# Patient Record
Sex: Female | Born: 1950 | Race: White | Hispanic: No | Marital: Married
Health system: Midwestern US, Academic
[De-identification: ages and names within clinical notes are randomized; demographics above are authoritative.]

---

## 2017-01-17 ENCOUNTER — Encounter: Admit: 2017-01-17 | Discharge: 2017-01-17 | Payer: MEDICARE

## 2017-01-17 DIAGNOSIS — C50919 Malignant neoplasm of unspecified site of unspecified female breast: Principal | ICD-10-CM

## 2017-01-17 NOTE — Telephone Encounter
Spoke with patient regarding results of MRI: meningioma is stable (no change), no signs of masses. Results faxed to patients otologists office as this is the doctor that ordered the scan. Fax number [phone]

## 2017-01-17 NOTE — Progress Notes
MRI brain from January 14, 2017 [facility] shows a meningioma of the left frontal lobe stable.  Negative for mass in the cerebellopontine angle or internal auditory canal.  They have me down with the ordering physician but I did not order this study.

## 2017-07-11 ENCOUNTER — Encounter: Admit: 2017-07-11 | Discharge: 2017-07-11 | Payer: MEDICARE

## 2017-07-11 DIAGNOSIS — C50912 Malignant neoplasm of unspecified site of left female breast: Principal | ICD-10-CM

## 2017-07-11 DIAGNOSIS — C773 Secondary and unspecified malignant neoplasm of axilla and upper limb lymph nodes: ICD-10-CM

## 2017-07-11 DIAGNOSIS — N809 Endometriosis, unspecified: Principal | ICD-10-CM

## 2017-07-11 DIAGNOSIS — M810 Age-related osteoporosis without current pathological fracture: ICD-10-CM

## 2017-07-11 DIAGNOSIS — Z923 Personal history of irradiation: ICD-10-CM

## 2017-07-11 DIAGNOSIS — Z171 Estrogen receptor negative status [ER-]: ICD-10-CM

## 2017-07-11 DIAGNOSIS — E039 Hypothyroidism, unspecified: ICD-10-CM

## 2017-07-11 DIAGNOSIS — Z79810 Long term (current) use of selective estrogen receptor modulators (SERMs): ICD-10-CM

## 2017-07-11 DIAGNOSIS — C50919 Malignant neoplasm of unspecified site of unspecified female breast: ICD-10-CM

## 2018-01-25 LAB — COMPREHENSIVE METABOLIC PANEL
Lab: 0.6
Lab: 1.1 — ABNORMAL LOW (ref 3.7–6.7)
Lab: 10
Lab: 107
Lab: 146
Lab: 22
Lab: 29
Lab: 7.5
Lab: 87

## 2018-01-25 LAB — LIPID PROFILE
Lab: 211 — ABNORMAL HIGH (ref <200)
Lab: 86 — ABNORMAL HIGH (ref 40–60)

## 2018-01-27 ENCOUNTER — Encounter: Admit: 2018-01-27 | Discharge: 2018-01-27 | Payer: MEDICARE

## 2018-07-10 ENCOUNTER — Encounter: Admit: 2018-07-10 | Discharge: 2018-07-10 | Payer: MEDICARE

## 2018-07-10 DIAGNOSIS — Z1231 Encounter for screening mammogram for malignant neoplasm of breast: ICD-10-CM

## 2018-07-10 DIAGNOSIS — C50912 Malignant neoplasm of unspecified site of left female breast: Principal | ICD-10-CM

## 2018-09-11 ENCOUNTER — Encounter: Admit: 2018-09-11 | Discharge: 2018-09-11

## 2018-09-11 DIAGNOSIS — Z1231 Encounter for screening mammogram for malignant neoplasm of breast: Secondary | ICD-10-CM

## 2018-09-11 DIAGNOSIS — C50919 Malignant neoplasm of unspecified site of unspecified female breast: Secondary | ICD-10-CM

## 2018-09-11 DIAGNOSIS — N809 Endometriosis, unspecified: Secondary | ICD-10-CM

## 2018-09-11 DIAGNOSIS — Z08 Encounter for follow-up examination after completed treatment for malignant neoplasm: Secondary | ICD-10-CM

## 2018-09-11 DIAGNOSIS — Z86000 Personal history of in-situ neoplasm of breast: Secondary | ICD-10-CM

## 2018-09-11 DIAGNOSIS — C50912 Malignant neoplasm of unspecified site of left female breast: Secondary | ICD-10-CM

## 2018-09-11 DIAGNOSIS — E039 Hypothyroidism, unspecified: Secondary | ICD-10-CM

## 2018-09-11 DIAGNOSIS — M810 Age-related osteoporosis without current pathological fracture: Secondary | ICD-10-CM

## 2018-09-11 DIAGNOSIS — Z171 Estrogen receptor negative status [ER-]: Secondary | ICD-10-CM

## 2018-09-11 NOTE — Progress Notes
Name: Toni Cooke          MRN: [id number]      DOB: 06-Apr-1950      AGE: 68 y.o.   DATE OF SERVICE: 09/11/2018    Subjective:             Reason for Visit: Annual breast cancer follow-up visit  Heme/Onc Care      Toni Cooke is a 68 y.o. female.     Cancer Staging  Breast cancer [facility])  Staging form: Breast, AJCC 7th Edition  - Pathologic: Stage IIA (T1c, N1a, cM0) - Signed by Jorene Guest, MD on 02/06/2014      History of Present Illness  Toni Cooke is seen today in annual follow-up of her past breast cancer.  She remains in remission.  She underwent right screening 3D mammogram at [facility] 07/10/2018 with benign findings.  She underwent cardiac ablation in April 2019 and has not been in atrial fib since.  She is no longer on the Eliquis.    She and her husband continue to live in their RV and are planning to head to [place] next.  They keep an address in [place] but do not live there.       Review of Systems  Review of Systems   Constitutional: Negative for fever and chills.   HENT: Negative for ear pain, sore throat, mouth sores, trouble swallowing, neck pain and sinus pressure.    Eyes: Negative for visual disturbance.   Respiratory: Negative for shortness of breath.    Cardiovascular: Negative for chest pain.   Gastrointestinal: Negative for nausea, diarrhea, constipation and blood in stool.   Genitourinary: Negative for dysuria, urgency, frequency, difficulty urinating and dyspareunia.   Musculoskeletal: Negative.    Neurological: Negative for weakness, numbness and headaches.   Hematological: Does not bruise/bleed easily.   Psychiatric/Behavioral: Negative for confusion and decreased concentration. The patient is not nervous/anxious.        Objective:         ??? aspirin EC 81 mg tablet Take 81 mg by mouth daily. Take with food.   ??? CALCIUM CARBONATE/VITAMIN D3 (CALCIUM + D PO) Take 1 Tab by mouth daily.   ??? COLCRYS 0.6 mg tablet Take 1 tablet by mouth twice daily. ??? DENOSUMAB (PROLIA SC) Inject  into area(s) as directed. every 6 months   ??? flecainide (TAMBOCOR) 50 mg tablet Take 50 mg by mouth twice daily. Take 1 1/2 po bid = 75mg  BID   ??? ibuprofen (ADVIL) 200 mg tablet Take 200 mg by mouth every 6 hours as needed for Pain. Take with food.   ??? levothyroxine (SYNTHROID) 75 mcg tablet Take 75 mcg by mouth daily.   ??? metoprolol XL (TOPROL XL) 25 mg extended release tablet Take 1 tablet by mouth daily.   ??? pantoprazole DR (PROTONIX) 40 mg tablet Take 1 tablet by mouth twice daily.   ??? sucralfate (CARAFATE) 1 gram tablet Take 1 tablet by mouth twice daily.     Vitals:    09/11/18 0803 09/11/18 0804   BP: 110/60    Pulse: 58    Resp: 18    Temp: 36.3 ???C (97.3 ???F)    TempSrc: Temporal Temporal   SpO2: 98%    Weight: 60 kg (132 lb 3.2 oz)    Height: 157.5 cm (62)    PainSc: Zero      Body mass index is 24.18 kg/m???.     Pain Score: Zero  Fatigue Scale: 0-None    Pain Addressed:  N/A    Patient Evaluated for a Clinical Trial: Patient not eligible for a treatment trial (including not needing treatment, needs palliative care, in remission).     [name] is 0, Fully active, able to carry on all pre-disease performance without restriction.Marland Kitchen     Physical Exam     Very pleasant petite Caucasian female in no distress.  HEENT: No abnormalities.  Breast: Right breast without mass or adenopathy.  Left breast surgically absent with implant reconstruction.  Extremities without lymphedema.  Neuro: Alert and oriented x3.     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  Status post [facility] chemo which completed December 2012.  She is doing well with no evidence recurrence and continues with annual follow-up.

## 2019-04-18 IMAGING — MR MRI KNEE RT WO CONTRAST
5 of 6 series · 33 of 40 positions shown · non-contrast
Comparison: None.

INDICATION: Right knee pain
TECHNIQUE: Multiplanar, multiecho imaging of the right knee was performed, including T1-weighted and fluid sensitive sequences without intravenous contrast administration.

[Series 2: t2_axial_fs · axial · 4.0mm · 0.41mm/px · z∈[-26,+89]mm · 7 of 24 slices shown]
[im 1/24]
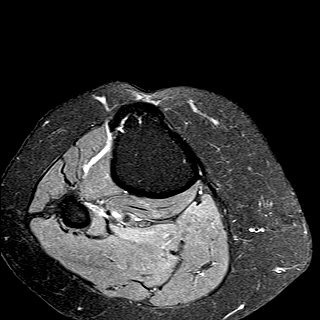
[im 4/24]
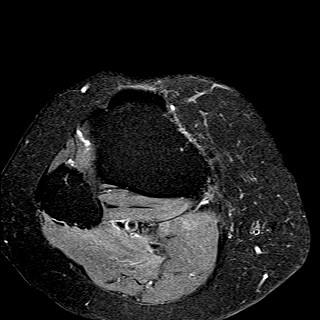
[im 8/24]
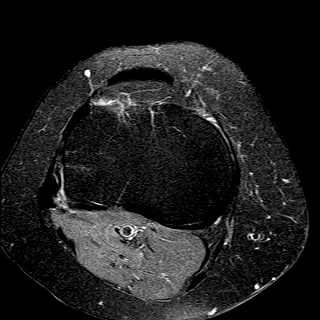
[im 12/24]
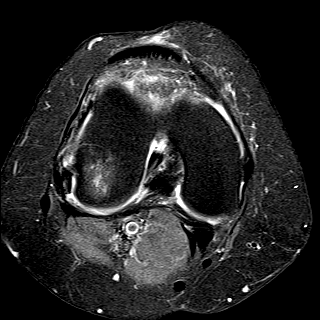
[im 16/24]
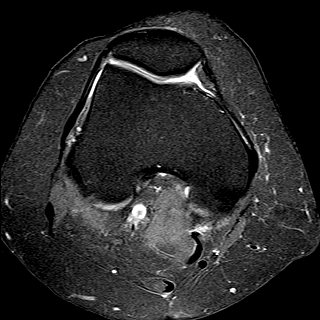
[im 20/24]
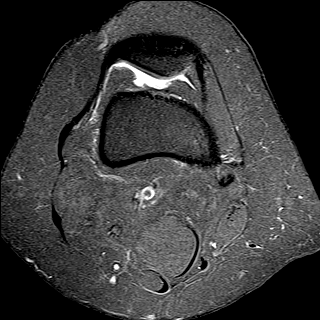
[im 24/24]
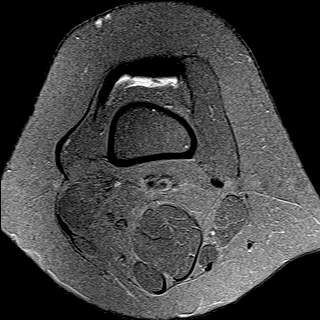

[Series 4: pd_sag_fs · sagittal · 3.0mm · 0.53mm/px · 7 of 25 slices shown]
[im 1/25]
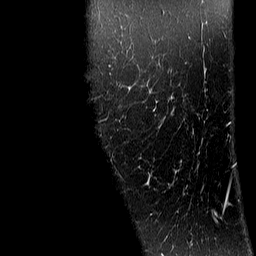
[im 5/25]
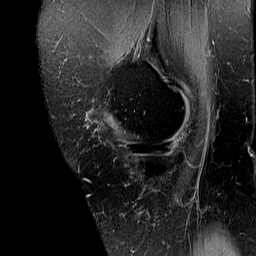
[im 9/25]
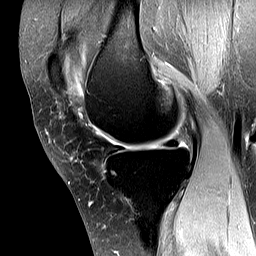
[im 13/25]
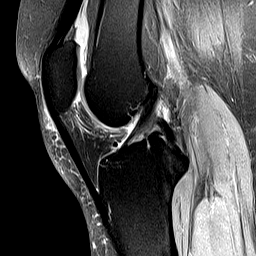
[im 17/25]
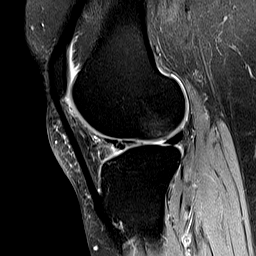
[im 21/25]
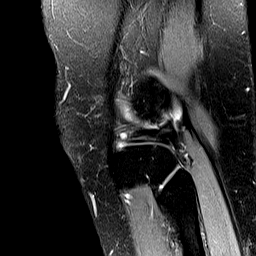
[im 25/25]
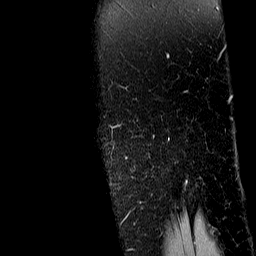

[Series 5: t2_sag_fs · sagittal · 3.0mm · 0.53mm/px · 7 of 25 slices shown]
[im 1/25]
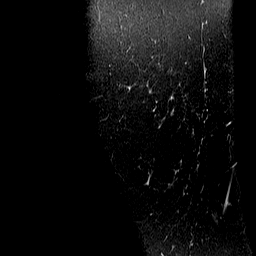
[im 5/25]
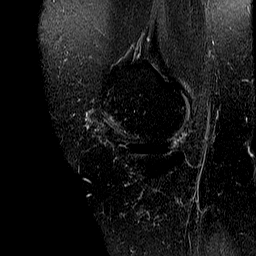
[im 9/25]
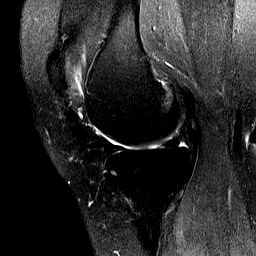
[im 13/25]
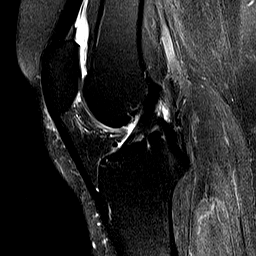
[im 17/25]
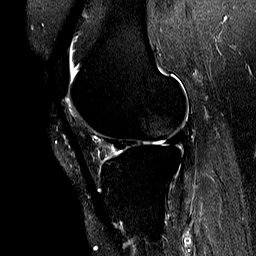
[im 21/25]
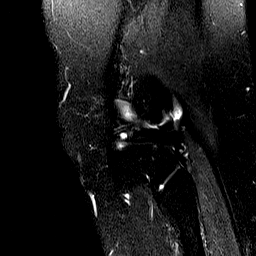
[im 25/25]
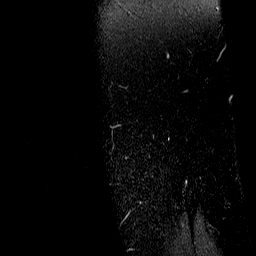

[Series 6: t1_cor · coronal · 4.0mm · 0.35mm/px · 6 of 20 slices shown]
[im 1/20]
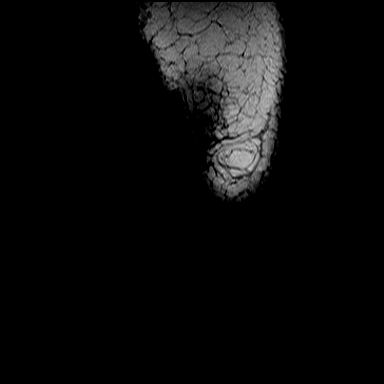
[im 4/20]
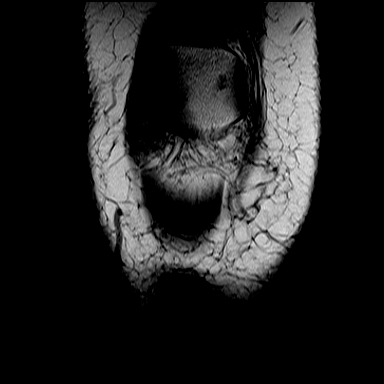
[im 8/20]
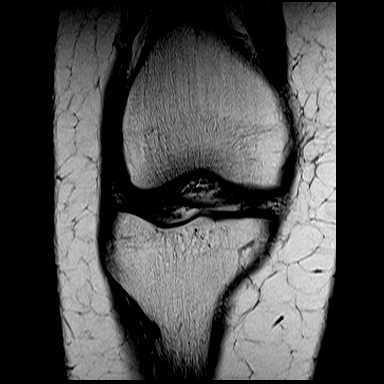
[im 12/20]
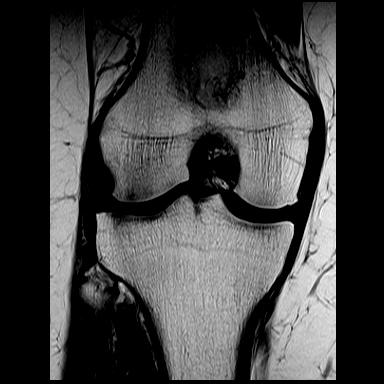
[im 16/20]
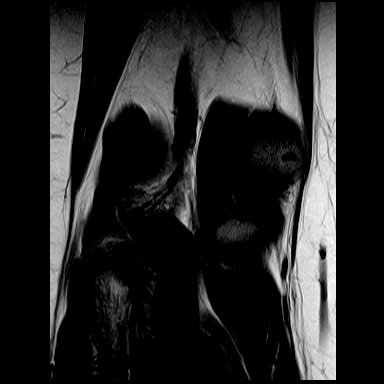
[im 20/20]
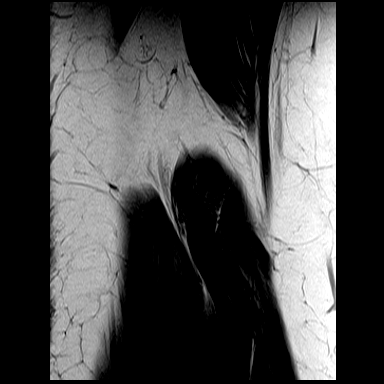

[Series 7: t2_cor_fs · coronal · 4.0mm · 0.41mm/px · 6 of 20 slices shown]
[im 1/20]
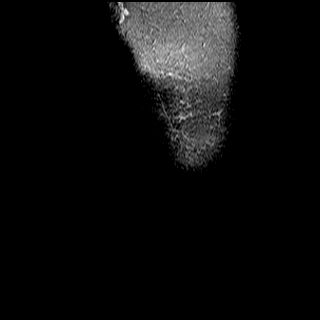
[im 4/20]
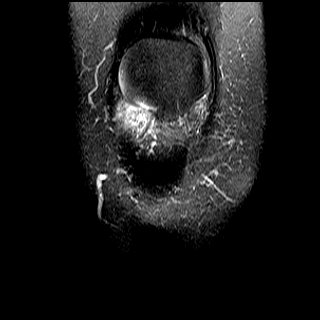
[im 8/20]
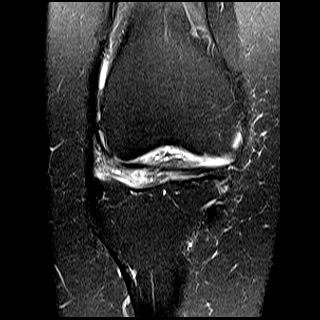
[im 12/20]
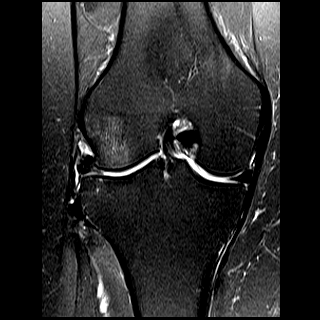
[im 16/20]
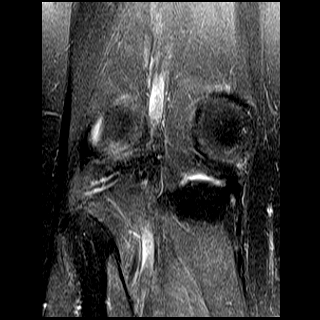
[im 20/20]
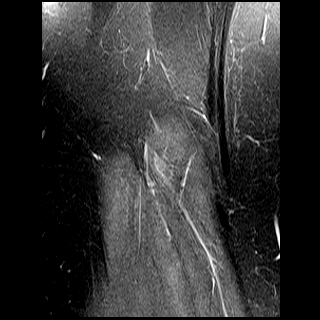

[33 of 40 positions shown; findings below may reference images not displayed]

FINDINGS: MEDIAL MENISCUS:  Mild intrasubstance degeneration at the body. Small tear near the free edge of the posterior horn/root with displaced flap toward the notch.

LATERAL MENISCUS:  Complex tear of the anterior horn and body with vertical undersurface and radial free edge components. Fraying of the free edge of the posterior horn.

ACL:  Intact.

PCL:  Intact.

MCL:  Intact.

LATERAL LIGAMENTS AND TENDONS:  Intact.

EXTENSOR MECHANISM:  The quadriceps and patellar tendons are intact.

FAT PADS:   Edema in the superior lateral aspect of Hoffa's fat, compatible with impingement.

CARTILAGE:  

Patellofemoral compartment:  Small high-grade partial thickness chondral defect at the medial patellar facet.

Medial compartment:  Chondral surface irregularity at the posterior weightbearing femoral condyle.

Lateral compartment: Extensive high-grade partial thickness chondral loss, particularly at the posterior tibial plateau and posterior weightbearing femoral condyle with subchondral reactive changes.

BONE MARROW: Subchondral reactive changes at the posterior lateral compartment. No acute fracture.

Small joint effusion. No Baker's cyst.
IMPRESSION: 1. Small tear near the free edge of the posterior horn/root medial meniscus, with displaced flap toward the notch.

2. Complex tear of the anterior horn and body lateral meniscus. Fraying at the free edge of the posterior horn lateral meniscus.

3. Moderate lateral compartment chondral abnormalities.

4. Mild patellofemoral and medial compartment chondral abnormalities.

## 2019-07-23 ENCOUNTER — Encounter: Admit: 2019-07-23 | Discharge: 2019-07-23 | Payer: MEDICARE

## 2019-07-23 DIAGNOSIS — Z1231 Encounter for screening mammogram for malignant neoplasm of breast: Secondary | ICD-10-CM

## 2019-07-23 DIAGNOSIS — C50912 Malignant neoplasm of unspecified site of left female breast: Secondary | ICD-10-CM

## 2020-07-16 ENCOUNTER — Encounter: Admit: 2020-07-16 | Discharge: 2020-07-16 | Payer: MEDICARE

## 2020-07-16 DIAGNOSIS — C50919 Malignant neoplasm of unspecified site of unspecified female breast: Secondary | ICD-10-CM

## 2020-07-16 DIAGNOSIS — Z1231 Encounter for screening mammogram for malignant neoplasm of breast: Secondary | ICD-10-CM

## 2020-07-16 DIAGNOSIS — N809 Endometriosis, unspecified: Secondary | ICD-10-CM

## 2020-07-16 DIAGNOSIS — E039 Hypothyroidism, unspecified: Secondary | ICD-10-CM

## 2020-07-16 DIAGNOSIS — M810 Age-related osteoporosis without current pathological fracture: Secondary | ICD-10-CM

## 2020-07-16 DIAGNOSIS — C50912 Malignant neoplasm of unspecified site of left female breast: Secondary | ICD-10-CM

## 2020-07-16 NOTE — Progress Notes
Name: Toni Cooke          MRN: [id number]      DOB: 27-Oct-1950      AGE: 70 y.o.   DATE OF SERVICE: 07/16/2020    Subjective:             Reason for Visit: Annual breast cancer follow-up visit  Heme/Onc Care      Toni Cooke is a 70 y.o. female.     Cancer Staging  Breast cancer [facility])  Staging form: Breast, AJCC 7th Edition  - Pathologic: Stage IIA (T1c, N1a, cM0) - Signed by Toni Guest, MD on 02/06/2014      History of Present Illness  Toni Cooke is seen today in annual follow-up of her past breast cancer.  She remains in remission.  She is scheduled for right screening 3D mammogram at [facility] on 07/21/2020.    She underwent cardiac ablation in April 2019 and has not been in atrial fib since.  She is no longer on the Eliquis.  Unfortunately, 6 weeks ago she developed some significant palpitations with heart rate up to 140.  She has now been following regularly with her cardiologist Dr. Betti Cooke and he is concerned that she may have myocarditis.  She had a cardiac PET performed yesterday with results pending.  Heart rate is much better controlled with diltiazem.  Need a repeat ablation or immune suppression depending upon final findings.    She had a 10-year follow-up colonoscopy in July 2020 and did have one cecal polyp removed which was a tubular adenoma.  I asked her to touch base with the gastroenterologist to see when they want to repeat a colonoscopy.  This will likely be in 5 years.    She and her husband continue to live in their RV and travel frequently.  They keep an address in [place] but do not live there.       Review of Systems  Positive for the palpitations and she gets fatigued when her heart is racing.  Doing better on the diltiazem.  Constitutional: Negative for fever and chills.   HENT: Negative for ear pain, sore throat, mouth sores, trouble swallowing, neck pain and sinus pressure.    Eyes: Negative for visual disturbance.   Respiratory: Negative for shortness of breath. Cardiovascular: Negative for chest pain.   Gastrointestinal: Negative for nausea, diarrhea, constipation and blood in stool.   Genitourinary: Negative for dysuria, urgency, frequency, difficulty urinating and dyspareunia.   Musculoskeletal: Negative.    Neurological: Negative for weakness, numbness and headaches.   Hematological: Does not bruise/bleed easily.   Psychiatric/Behavioral: Negative for confusion and decreased concentration. The patient is not nervous/anxious.        Objective:         ? aspirin EC 81 mg tablet Take 81 mg by mouth daily. Take with food.   ? CALCIUM CARBONATE/VITAMIN D3 (CALCIUM + D PO) Take 1 Tab by mouth daily.   ? DENOSUMAB (PROLIA SC) Inject  into area(s) as directed. every 6 months   ? dilTIAZem CD (CARDIZEM CD) 180 mg capsule Take 1 capsule by mouth daily.   ? levothyroxine (SYNTHROID) 75 mcg tablet Take 75 mcg by mouth daily.   ? metoprolol XL (TOPROL XL) 25 mg extended release tablet Take 1 tablet by mouth daily.     Vitals:    07/16/20 0821   BP: 131/61   BP Source: Arm, Right Upper   Pulse: 53   Temp: 36.2 ?C (97.2 ?F)  Resp: 18   SpO2: 100%   TempSrc: Temporal   PainSc: Zero   Weight: 66.2 kg (146 lb)   Height: 158 cm (5' 2.21)     Body mass index is 26.53 kg/m?Marland Kitchen     Pain Score: Zero       Fatigue Scale: 0-None    Pain Addressed:  N/A    Patient Evaluated for a Clinical Trial: Patient not eligible for a treatment trial (including not needing treatment, needs palliative care, in remission).     [name] is 0, Fully active, able to carry on all pre-disease performance without restriction.Marland Kitchen     Physical Exam     Very pleasant petite Caucasian female in no distress.  HEENT: No abnormalities.  Cardiac: Regular.  Currently no arrhythmia or tachycardia.  Pulmonary: Clear throughout all fields.  Breast: Right breast without mass or adenopathy.  Left breast surgically absent with implant reconstruction.  Extremities without lymphedema. Neuro: Alert and oriented x3.     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  Status post [facility] chemo which completed December 2012.  She is doing well with no evidence recurrence and continues with annual follow-up.  She is scheduled for her right breast mammogram on 07/21/2020.

## 2020-07-23 ENCOUNTER — Encounter: Admit: 2020-07-23 | Discharge: 2020-07-23 | Payer: MEDICARE

## 2020-07-23 DIAGNOSIS — C50912 Malignant neoplasm of unspecified site of left female breast: Secondary | ICD-10-CM

## 2020-07-23 DIAGNOSIS — Z1231 Encounter for screening mammogram for malignant neoplasm of breast: Secondary | ICD-10-CM

## 2021-05-13 ENCOUNTER — Encounter: Admit: 2021-05-13 | Discharge: 2021-05-13 | Payer: MEDICARE

## 2021-07-16 ENCOUNTER — Encounter: Admit: 2021-07-16 | Discharge: 2021-07-16 | Payer: MEDICARE

## 2021-07-19 NOTE — Progress Notes
Name: Toni Cooke          MRN: [id number]      DOB: 1950-12-19      AGE: 71 y.o.   DATE OF SERVICE: 07/20/2021    Subjective:             Reason for Visit:  Heme/Onc Care      Toni Cooke is a 71 y.o. female.      Cancer Staging   Breast cancer [facility])  Staging form: Breast, AJCC 7th Edition  - Pathologic: Stage IIA (T1c, N1a, cM0) - Signed by Jorene Guest, MD on 02/06/2014      History of Present Illness    Toni Cooke is a 71 y.o. female with h/o left breast IDC.  She had a left breast DCIS in 2009 and had lumpectomy and radiation and risk reduction with Tamoxifen for ER + disease. She took Tamoxifen for risk reduction.  The patient is not sure for how long she was on the medication.  Could not find information on chart review as well.    She had an abnormal finding in the left breast in 2011 and had a mastectomy and was found to have an invasive ductal carcinoma with ER- and HER2+ disease with 1/16 lymph nodes positive. Completed implant based reconstruction.   She received adjuvant therapy Taxotere, Carboplatin, and herceptin with Dr. Willette Pa and completed the herceptin therapy in Dec of 2012.        HPI: Toni Cooke is here to establish care  Had an bout of diverticulitis in Dec 2022, trying to take dietary precautions.  This has been helpful, no recurrence since then.  Has seen GI physician.  Also had f/u cardiology since the last visit.        Review of Systems   Constitutional: Negative for activity change, appetite change, chills, diaphoresis and fever.   HENT: Negative.    Eyes: Negative.    Respiratory: Negative for cough and shortness of breath.    Cardiovascular: Negative for chest pain and palpitations.   Gastrointestinal: Negative for abdominal pain, blood in stool, constipation, diarrhea, nausea and vomiting.   Genitourinary: Negative.    Musculoskeletal: Negative.    Skin: Negative for rash.   Neurological: Negative for dizziness, weakness, light-headedness, numbness and headaches.   Psychiatric/Behavioral: Negative.          Objective:         ? aspirin EC 81 mg tablet Take one tablet by mouth daily. Take with food.   ? CALCIUM CARBONATE/VITAMIN D3 (CALCIUM + D PO) Take 1 Tab by mouth daily.   ? clopiDOGreL (PLAVIX) 75 mg tablet Take one tablet by mouth daily.   ? DENOSUMAB (PROLIA SC) Inject  into area(s) as directed. every 6 months   ? flecainide (TAMBOCOR) 50 mg tablet Take one tablet by mouth.   ? levothyroxine (SYNTHROID) 75 mcg tablet Take one tablet by mouth daily.   ? metoprolol XL (TOPROL XL) 25 mg extended release tablet Take one tablet by mouth daily.     Vitals:    07/20/21 0822   BP: 136/60   BP Source: Arm, Right Upper   Pulse: 50   Temp: 36.6 ?C (97.9 ?F)   Resp: 18   SpO2: 100%   TempSrc: Temporal   PainSc: Zero   Weight: 60.5 kg (133 lb 6.4 oz)   Height: 158 cm (5' 2.21)     Body mass index is 24.24 kg/m?Marland Kitchen     Pain Score:  Zero       Fatigue Scale: 0-None    Pain Addressed:  N/A    Patient Evaluated for a Clinical Trial: Patient not eligible for a treatment trial (including not needing treatment, needs palliative care, in remission).     [name] is 0, Fully active, able to carry on all pre-disease performance without restriction.Marland Kitchen     Physical Exam  Exam conducted with a chaperone present.   Constitutional:       Appearance: She is well-developed.   HENT:      Head: Normocephalic.   Eyes:      Conjunctiva/sclera: Conjunctivae normal.   Cardiovascular:      Rate and Rhythm: Normal rate and regular rhythm.   Pulmonary:      Effort: Pulmonary effort is normal.      Breath sounds: No wheezing.   Chest:       Musculoskeletal:         General: Normal range of motion.      Cervical back: Neck supple.   Lymphadenopathy:      Cervical: No cervical adenopathy.   Neurological:      Mental Status: She is alert and oriented to person, place, and time.   Psychiatric:         Behavior: Behavior normal.         Thought Content: Thought content normal.         Judgment: Judgment normal.               Assessment and Plan:  Left breast IDC ER, PR neg and HER2 3+ positive s/p mastectomy in 2011. Adjuvant TCH   No evidence of recurrence.  Exam completed at today's visit.  Right breast Mammo sch 5/5 at [facility]  I have reviewed natural history of HER2 positive breast cancers, management, prognosis.  She is 12 years out and cured from the HER2 positive breast cancer.  She can continue follow-ups with survivorship clinic moving forward.  Return precautions reviewed with her.    Left breast DCIS diagnosed in 2009 status post lumpectomy, hormone receptor positive.  She was on risk reduction with tamoxifen for some time.  Patient is not sure about the duration and I could not find this on chart review as well.  No indication to resume treatment at this time.  Encouraged  a heart healthy diet, regular exercise and an active lifestyle, taking calcium either via diet or supplementation,vitamin D, weightbearing exercises, and continuing routine follow-up visits and surveillance imaging.      Osteoporosis: Previously managed by endocrinology Dr. Alvester Morin.  She was on Forteo.  After her endocrinology moved to [place], osteoporosis has been managed by Dr. Cheral Marker.    On Prolia currently.  Also has follow-up bone density scheduled for 07/24/2021.  General management of osteoporosis also reviewed with her.    RTC in 1 year with survivorship  She was given ample time to ask questions and stated no further questions at the end of the visit.   Parts of this note were created with voice recognition software. Please excuse any grammatical or typographical errors.

## 2021-07-20 ENCOUNTER — Encounter: Admit: 2021-07-20 | Discharge: 2021-07-20 | Payer: MEDICARE

## 2021-07-20 DIAGNOSIS — C50912 Malignant neoplasm of unspecified site of left female breast: Secondary | ICD-10-CM

## 2021-07-20 DIAGNOSIS — C50919 Malignant neoplasm of unspecified site of unspecified female breast: Secondary | ICD-10-CM

## 2021-07-20 DIAGNOSIS — D0512 Intraductal carcinoma in situ of left breast: Secondary | ICD-10-CM

## 2021-07-20 DIAGNOSIS — N809 Endometriosis, unspecified: Secondary | ICD-10-CM

## 2021-07-20 DIAGNOSIS — M810 Age-related osteoporosis without current pathological fracture: Secondary | ICD-10-CM

## 2021-07-20 DIAGNOSIS — E039 Hypothyroidism, unspecified: Secondary | ICD-10-CM

## 2022-07-21 ENCOUNTER — Encounter: Admit: 2022-07-21 | Discharge: 2022-07-21 | Payer: MEDICARE

## 2022-07-23 ENCOUNTER — Encounter: Admit: 2022-07-23 | Discharge: 2022-07-23 | Payer: MEDICARE

## 2022-07-23 DIAGNOSIS — M810 Age-related osteoporosis without current pathological fracture: Secondary | ICD-10-CM

## 2022-07-23 DIAGNOSIS — N809 Endometriosis, unspecified: Secondary | ICD-10-CM

## 2022-07-23 DIAGNOSIS — E039 Hypothyroidism, unspecified: Secondary | ICD-10-CM

## 2022-07-23 DIAGNOSIS — C50919 Malignant neoplasm of unspecified site of unspecified female breast: Secondary | ICD-10-CM

## 2022-07-23 DIAGNOSIS — C50912 Malignant neoplasm of unspecified site of left female breast: Secondary | ICD-10-CM

## 2022-07-23 DIAGNOSIS — M8580 Other specified disorders of bone density and structure, unspecified site: Secondary | ICD-10-CM

## 2022-07-23 NOTE — Progress Notes
Name: Toni Cooke          MRN: [id number]      DOB: [birth year]      AGE: 72 y.o.   DATE OF SERVICE: 07/23/2022    Subjective:             Reason for Visit:  Heme/Onc Care      Toni Cooke is a 72 y.o. female.      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  Patient had lumpectomy, radiation, and tamoxifen.  Invasive component in 2011 was ER negative HER2/neu positive.  Patient received adjuvant chemotherapy following mastectomy.    No evidence of recurrence.  Patient will continue on observation yearly.  Patient had reconstructive surgery.    She was diagnosed at age 34.  At that time genetic testing guidelines did not suggest testing.  We discussed the pros and cons of testing today and she does not have a strong family history of cancer.  Her father had lung cancer but was exposure related.  She has had some colon polyps but no cancer.  She has no change in family or social history.     They enjoyed traveling.  They spend time in [place], time in [place], and they are looking forward to traveling up and to [place] in the near future.    On the survivorship care survey she denies any shortness of breath at this time.  No anxiety, no distress, no cognitive changes, no fatigue, no significant lymphedema, hemoglobin 1 in the left ribs.  She denies any difficulty with sleep.  No sexual dysfunction, she does have       Review of Systems   Constitutional:  Negative for activity change, appetite change, chills, fatigue and fever.        She has sold house and the mobile RV and they travel.    HENT:  Negative for congestion, mouth sores, sore throat and trouble swallowing.    Eyes:  Negative for visual disturbance.        New dry macular degeneration.    Respiratory:  Negative for shortness of breath.    Cardiovascular:  Positive for palpitations (PVC at times, she has a heart recorder at home and follows cardiology about every 6 months.). Negative for chest pain.        She had Afib and had to do 4 ablation procedures. Had placement of the Watchman about 2- three years ago.   Gastrointestinal:  Negative for abdominal pain, constipation, diarrhea, nausea and vomiting. Blood in stool: on occasion with flare of Diverticulitis.  Last colonoscopy done 2023.       She has diverticulosis and has been Hydrated and IV in the ER but no acute changes.    Endocrine:        She is seeing an endocrinologist. She had bone density 12/2021 and has been on Prolia for many years.    Genitourinary:  Negative for difficulty urinating, urgency and vaginal pain.   Musculoskeletal:  Negative for back pain.        Left rib discomfort and pain in the last month.       Skin: Negative.    Neurological:  Positive  for dizziness (with some change in position.). Negative for numbness and headaches.   Psychiatric/Behavioral: Negative.           Objective:          aspirin EC 81 mg tablet Take one tablet by mouth daily. Take with food.    CALCIUM CARBONATE/VITAMIN D3 (CALCIUM + D PO) Take 1 Tab by mouth daily.    DENOSUMAB (PROLIA SC) Inject  into area(s) as directed. every 6 months    flecainide (TAMBOCOR) 50 mg tablet Take one tablet by mouth.    levothyroxine (SYNTHROID) 75 mcg tablet Take one tablet by mouth daily.    metoprolol XL (TOPROL XL) 25 mg extended release tablet Take one tablet by mouth daily.     Vitals:    07/23/22 1442   BP: 136/72   BP Source: Arm, Right Upper   Pulse: 56   Temp: 36.6 ?C (97.9 ?F)   Resp: 16   SpO2: 100%   TempSrc: Temporal   PainSc: Zero   Weight: 64.4 kg (142 lb)   Height: 158 cm (5' 2.21)     Body mass index is 25.8 kg/m?Marland Kitchen     Pain Score: Zero       Fatigue Scale: 0-None    Pain Addressed:  Diagnostic test ordered    Patient Evaluated for a Clinical Trial: Patient not eligible for a treatment trial (including not needing treatment, needs palliative care, in remission).     [name] is 0, Fully active, able to carry on all pre-disease performance without restriction.Marland Kitchen     Physical Exam  Vitals and nursing note reviewed.   Constitutional:       Appearance: She is well-developed.   HENT:      Head: Normocephalic.   Eyes:      Pupils: Pupils are equal, round, and reactive to light.   Cardiovascular:      Rate and Rhythm: Normal rate and regular rhythm.      Heart sounds: Normal heart sounds. No murmur heard.     No gallop.   Pulmonary:      Effort: Pulmonary effort is normal.      Breath sounds: Normal breath sounds. No wheezing or rales.   Chest:          Comments: Left breast mastectomy with implant reconstruction.  There is a abnormal palpation in the lower aspect approximately the 7:30 position which is related to her loop cardiovascular device.  No axillary adenopathy.    Right breast has increased densities and palpation in the upper aspect of the right breast with no new mass or axillary adenopathy.  No nipple change.    Patient does have significant moles and red cherry hemangioma moles.  These are stable.  Abdominal:      General: Bowel sounds are normal.      Palpations: There is no mass.      Tenderness: There is no abdominal tenderness.      Comments: no organomegaly.   Musculoskeletal:         General: Swelling: mild in the left arm. Normal range of motion.      Cervical back: Normal range of motion and neck supple.   Lymphadenopathy:      Cervical: No cervical adenopathy.   Skin:     General: Skin is warm and dry.   Neurological:      General: No focal deficit present.      Mental Status: She is  alert and oriented to person, place, and time.   Psychiatric:         Mood and Affect: Mood normal.               Assessment and Plan:    ICD-9-CM ICD-10-CM    1. Malignant neoplasm of left breast in female, estrogen receptor negative, unspecified site of breast (HCC)  174.9 C50.912 RIBS BILAT W CHEST MIN 4V    V86.1 Z17.1 ONCBCN CLINIC APPT REQUEST      2. Osteopenia, unspecified location  733.90 M85.80       3. Hypothyroidism, unspecified type  244.9 E03.9           Problem   Breast Cancer (Hcc)    left breast DCIS in 2009 and had lumpectomy and radiation and started on Tamoxifen for ER + disease.   She had an abnormal finding in the left breast in 2011 and had a mastectomy and was found to have an invasive ductal carcinoma with ER- and HER2+ disease with 1/16 lymph nodes positive.   She received therapy with Taxotere, Carboplatin, and herceptin and completed the herceptin therapy in Dec of 2012.      Osteopenia   Hypothyroid       Breast cancer (HCC)  72 year old patient originally diagnosed at age 6 with a DCIS of the left breast.  She underwent lumpectomy radiation and started on tamoxifen.  On routine follow-up and screening in 2011 she was found to have a second mass.  This was ER negative HER2/neu positive.  She underwent mastectomy and reconstruction.  She had chemotherapy.  She had 1 of 16 positive nodes.  She continued with a total of 1 year of Herceptin.  No therapy is needed at this time.  No evidence of recurrence.  Patient will continue with right breast mammography due on 07/27/2022.  She will follow-up in 1 year.    Continue to follow-up with her primary care physician for routine health and screening to include CBC, CMP, and cholesterol screening.  Continue to follow-up with her endocrinologist and evaluate continuation of Prolia.  Her last bone density was performed in October 2023.  Plan to repeat bone density in 2025.    Patient has had some new bone discomfort in the lower left rib cage.  She states that this has been intermittent and been over the last 2 weeks.  She is scheduled for an upcoming mammogram and we will plan on getting some rib films done at diagnostic imaging at the same time.    Patient has history of cardiac disease.  She does have a strong family history of cardiac disease but also had left chest wall radiation and Herceptin therapy for over 1 year.  She does have a watchman in place and continues to follow-up with a cardiologist every 6 months.  She has a monitor at home to record any abnormal rhythms.  Medications include flecainide and metoprolol.    Patient to contact us if she has any new or concerning symptoms prior to her next visit.  Patient verbalized understanding of the plan of care.  The following signs or symptoms were reviewed.  Reviewed signs to watch for that could indicate a local or distant recurrence. Patient will call our office with any new signs.   Local recurrence  Signs and symptoms of local recurrence following mastectomy may include:   One or more painless nodules on or under the skin of your chest wall   A new area  of thickening along or near the mastectomy scar  Regional recurrence   A regional breast cancer recurrence means the cancer has come back in the lymph nodes in your armpit or collarbone area. Signs and symptoms of regional recurrence may include:   A lump or swelling in the lymph nodes under your arm or in the groove above your collarbone   Swelling of your arm (this could be related to lymphedema or even a blood clot)  Persistent pain in your arm and shoulder   Increasing loss of sensation in your arm and hand  Distant (metastatic) recurrence   A distant, or metastatic, recurrence means the cancer has traveled to distant parts of the body, most commonly the bones, liver and lungs. The signs and symptoms may include:   Pain, such as chest or bone pain   Persistent, dry cough   Difficulty breathing   Loss of appetite   Persistent nausea, vomiting or weight loss   Swelling in the abdomen  Severe headaches  When to call our office   You know your body best -- what feels normal and what doesn't. It's important to be aware of the signs and symptoms of recurrent breast cancer, such as:   New and persistent pain   Changes or new lumps in your surgical scar or chest wall   Weight loss   Shortness of breath  If you experience any signs and symptoms that might suggest a recurrence, call our office. Patient verbalized understanding and phone numbers provided.       Discussed that she can continue to follow-up on a yearly basis and she is agreeable.    Osteopenia  Continues on Prolia every 6 months due for next injection approximately Aug 09, 2022    Hypothyroid  Patient with history of hypothyroid and continues on 75 mcg daily.    outside lab done in 01/2022 and again 06/2022 and have been reviewed.     Parts of this note were created with voice recognition software. Please excuse any grammatical or typographical errors.

## 2022-07-23 NOTE — Assessment & Plan Note
72 year old patient originally diagnosed at age 39 with a DCIS of the left breast.  She underwent lumpectomy radiation and started on tamoxifen.  On routine follow-up and screening in 2011 she was found to have a second mass.  This was ER negative HER2/neu positive.  She underwent mastectomy and reconstruction.  She had chemotherapy.  She had 1 of 16 positive nodes.  She continued with a total of 1 year of Herceptin.  No therapy is needed at this time.  No evidence of recurrence.  Patient will continue with right breast mammography due on 07/27/2022.  She will follow-up in 1 year.    Continue to follow-up with her primary care physician for routine health and screening to include CBC, CMP, and cholesterol screening.  Continue to follow-up with her endocrinologist and evaluate continuation of Prolia.  Her last bone density was performed in October 2023.  Plan to repeat bone density in 2025.    Patient has had some new bone discomfort in the lower left rib cage.  She states that this has been intermittent and been over the last 2 weeks.  She is scheduled for an upcoming mammogram and we will plan on getting some rib films done at diagnostic imaging at the same time.    Patient has history of cardiac disease.  She does have a strong family history of cardiac disease but also had left chest wall radiation and Herceptin therapy for over 1 year.  She does have a watchman in place and continues to follow-up with a cardiologist every 6 months.  She has a monitor at home to record any abnormal rhythms.  Medications include flecainide and metoprolol.    Patient to contact us if she has any new or concerning symptoms prior to her next visit.  Patient verbalized understanding of the plan of care.  The following signs or symptoms were reviewed.  Reviewed signs to watch for that could indicate a local or distant recurrence. Patient will call our office with any new signs.   Local recurrence  Signs and symptoms of local recurrence following mastectomy may include:   One or more painless nodules on or under the skin of your chest wall   A new area of thickening along or near the mastectomy scar  Regional recurrence   A regional breast cancer recurrence means the cancer has come back in the lymph nodes in your armpit or collarbone area. Signs and symptoms of regional recurrence may include:   A lump or swelling in the lymph nodes under your arm or in the groove above your collarbone   Swelling of your arm (this could be related to lymphedema or even a blood clot)  Persistent pain in your arm and shoulder   Increasing loss of sensation in your arm and hand  Distant (metastatic) recurrence   A distant, or metastatic, recurrence means the cancer has traveled to distant parts of the body, most commonly the bones, liver and lungs. The signs and symptoms may include:   Pain, such as chest or bone pain   Persistent, dry cough   Difficulty breathing   Loss of appetite   Persistent nausea, vomiting or weight loss   Swelling in the abdomen  Severe headaches  When to call our office   You know your body best -- what feels normal and what doesn't. It's important to be aware of the signs and symptoms of recurrent breast cancer, such as:   New and persistent pain   Changes or  new lumps in your surgical scar or chest wall   Weight loss   Shortness of breath  If you experience any signs and symptoms that might suggest a recurrence, call our office. Patient verbalized understanding and phone numbers provided.       Discussed that she can continue to follow-up on a yearly basis and she is agreeable.

## 2022-07-23 NOTE — Assessment & Plan Note
Patient with history of hypothyroid and continues on 75 mcg daily.

## 2022-07-23 NOTE — Assessment & Plan Note
Continues on Prolia every 6 months due for next injection approximately Aug 09, 2022

## 2022-07-28 ENCOUNTER — Encounter: Admit: 2022-07-28 | Discharge: 2022-07-28 | Payer: MEDICARE

## 2022-08-02 ENCOUNTER — Encounter: Admit: 2022-08-02 | Discharge: 2022-08-02 | Payer: MEDICARE

## 2022-08-02 DIAGNOSIS — C50912 Malignant neoplasm of unspecified site of left female breast: Secondary | ICD-10-CM

## 2022-08-05 ENCOUNTER — Encounter: Admit: 2022-08-05 | Discharge: 2022-08-05 | Payer: MEDICARE

## 2022-08-05 DIAGNOSIS — M8580 Other specified disorders of bone density and structure, unspecified site: Secondary | ICD-10-CM

## 2022-08-05 DIAGNOSIS — C50912 Malignant neoplasm of unspecified site of left female breast: Secondary | ICD-10-CM

## 2022-08-05 NOTE — Telephone Encounter
-----   Message from [employer], APRN-NP sent at 08/03/2022  5:23 PM CDT -----  she does have a left rib fracture, likely trauma related, if having continued discomfort, may suggest getting a bone scan to further evaluate.

## 2022-08-05 NOTE — Telephone Encounter
Pt called and Was unavailable. Message left with Husband.  Call back # given if Pt has any questions or concerns.

## 2022-08-11 ENCOUNTER — Encounter: Admit: 2022-08-11 | Discharge: 2022-08-11 | Payer: MEDICARE

## 2022-08-11 DIAGNOSIS — M8580 Other specified disorders of bone density and structure, unspecified site: Secondary | ICD-10-CM

## 2022-08-11 DIAGNOSIS — C50912 Malignant neoplasm of unspecified site of left female breast: Secondary | ICD-10-CM

## 2022-08-18 ENCOUNTER — Encounter: Admit: 2022-08-18 | Discharge: 2022-08-18 | Payer: MEDICARE

## 2023-03-10 ENCOUNTER — Encounter: Admit: 2023-03-10 | Discharge: 2023-03-10 | Payer: MEDICARE

## 2023-07-06 ENCOUNTER — Encounter: Admit: 2023-07-06 | Discharge: 2023-07-06 | Payer: MEDICARE

## 2023-07-07 ENCOUNTER — Encounter: Admit: 2023-07-07 | Discharge: 2023-07-07 | Payer: MEDICARE

## 2023-07-12 ENCOUNTER — Encounter: Admit: 2023-07-12 | Discharge: 2023-07-12 | Payer: MEDICARE

## 2023-07-12 DIAGNOSIS — C50912 Malignant neoplasm of unspecified site of left female breast: Secondary | ICD-10-CM

## 2023-07-12 DIAGNOSIS — M8580 Other specified disorders of bone density and structure, unspecified site: Secondary | ICD-10-CM

## 2023-07-12 DIAGNOSIS — E039 Hypothyroidism, unspecified: Secondary | ICD-10-CM

## 2023-07-12 NOTE — Assessment & Plan Note
 followed by Endocrinology and getting Prolia every 6 months.

## 2023-07-12 NOTE — Assessment & Plan Note
 73 year old patient with a diagnosis of DCIS at age 72.  She had a left breast lumpectomy and radiation was started on tamoxifen.  Unfortunately she had a second mass developed in 2011.  At that time was ER negative HER2/neu positive she underwent mastectomy with reconstruction followed by chemotherapy.  She had 1 of 16 positive nodes.  She did 1 year of Herceptin.  Continues to follow-up with right breast mammography due again in May 2025.    At this time she has no evidence of recurrence.  No treatment.  Continues to follow-up on a yearly basis with clinical breast exam.  She will contact us  if she has any new or concerning symptoms prior to follow-up visit.    Continues to follow-up with her primary care physician for routine health and screening to include lab work.  Next bone density will be due in October 2025.  Continues to follow-up with her endocrinologist through [facility] and is on Prolia every 6 months.    Patient had bone scan performed in May 2024 with no evidence of lytic lesions.    Medications were reviewed and updated.  Patient verbalized understanding of the plan of care.  She will contact us  if she has any new or concerning symptoms prior to her next visit in 1 year.

## 2023-07-12 NOTE — Assessment & Plan Note
 Remains on Levothyroxine 75mcg daily.

## 2023-07-12 NOTE — Progress Notes
 Name: Toni Cooke          MRN: [id number]      DOB: 1950-07-22      AGE: 73 y.o.   DATE OF SERVICE: 07/12/2023    Subjective:             Reason for Visit:  Heme/Onc Care      Toni Cooke is a 73 y.o. female.      Cancer Staging   Breast cancer (CMS-HCC)  Staging form: Breast, AJCC 7th Edition  - Pathologic: Stage IIA (T1c, N1a, cM0) - Signed by Alyssa Jumper, MD on 02/06/2014      History of Present Illness  History of left breast invasive ductal carcinoma diagnosed originally in 2009 and again in 2011.  Patient had lumpectomy, radiation, and tamoxifen.  Invasive component in 2011 was ER negative HER2/neu positive.  Patient received adjuvant chemotherapy following mastectomy.     Bone density , Colonoscopy, and mammogram done 2023.  Bone scan done 07/2022  Mammogram is scheduled for May 2025.  Prolia injections every 6 months with [facility], Endocrinology.     She is unaccompanied.   She has not had any new pain.   She has general arthritis.   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 She has been doing much better.   She had it set for her heart rate, 60-80.  She has recently been to the Dermatologist and had three lesions, removed or frozen off.     She is going to be gone for the summer to [place] .              Review of Systems   Constitutional:  Negative for activity change, appetite change, chills, fatigue and fever.   HENT:  Negative for congestion, mouth sores, sore throat and trouble swallowing.    Eyes:  Negative for visual disturbance.   Respiratory:  Negative for shortness of breath.    Cardiovascular:  Negative for chest pain.   Gastrointestinal:  Negative for abdominal pain, constipation, diarrhea, nausea and vomiting.   Genitourinary:  Negative for difficulty urinating, urgency and vaginal pain.   Musculoskeletal:  Negative for back pain.   Skin: Negative.    Neurological:  Negative for dizziness, numbness and headaches.   Psychiatric/Behavioral: Negative. Objective:          aspirin EC 81 mg tablet Take one tablet by mouth daily. Take with food.    CALCIUM CARBONATE/VITAMIN D3 (CALCIUM + D PO) Take 1 Tab by mouth daily.    DENOSUMAB (PROLIA SC) Inject  into area(s) as directed. every 6 months    flecainide (TAMBOCOR) 50 mg tablet Take one tablet by mouth.    levothyroxine (SYNTHROID) 75 mcg tablet Take one tablet by mouth daily.    metoprolol XL (TOPROL XL) 25 mg extended release tablet Take one tablet by mouth daily.     Vitals:    07/12/23 0746   BP: (!) 148/73   BP Source: Arm, Right Upper   Pulse: 60   Temp: 36 ?C (96.8 ?F)   Resp: 18   SpO2: 100%   TempSrc: Temporal   PainSc: Zero   Weight: 66.4 kg (146 lb 6.4 oz)     Body mass index is 26.6 kg/m?Toni Cooke     Pain Score: Zero       Fatigue Scale: 0-None    Pain Addressed:  N/A    Patient Evaluated for a Clinical Trial: Patient not eligible for a treatment trial (  including not needing treatment, needs palliative care, in remission).     [name] is 0, Fully active, able to carry on all pre-disease performance without restriction.Toni Cooke     Physical Exam  Vitals and nursing note reviewed.   Constitutional:       Appearance: Normal appearance. She is well-developed.   HENT:      Head: Normocephalic.      Mouth/Throat:      Pharynx: No oropharyngeal exudate.   Eyes:      General: No scleral icterus.     Pupils: Pupils are equal, round, and reactive to light.   Cardiovascular:      Rate and Rhythm: Normal rate and regular rhythm.      Heart sounds: Normal heart sounds. No murmur heard.     No gallop.   Pulmonary:      Effort: Pulmonary effort is normal.      Breath sounds: Normal breath sounds. No wheezing or rales.   Chest:          Comments: left mastectomy and implant, no mass  Right breast no mass or adenopathy, increased density in the upper outer breast.   Abdominal:      General: Bowel sounds are normal.      Palpations: There is no mass.      Tenderness: There is no abdominal tenderness.      Comments: no organomegaly.   Musculoskeletal:         General: Normal range of motion.      Cervical back: Normal range of motion and neck supple.   Lymphadenopathy:      Cervical: No cervical adenopathy.   Skin:     General: Skin is warm and dry.   Neurological:      General: No focal deficit present.      Mental Status: She is alert and oriented to person, place, and time.   Psychiatric:         Mood and Affect: Mood normal.               Assessment and Plan:    ICD-9-CM ICD-10-CM    1. Malignant neoplasm of left breast in female, estrogen receptor negative, unspecified site of breast (CMS-HCC)  174.9 C50.912 ONCBCN CLINIC APPT REQUEST    V86.1 Z17.1 ONCBCN CLINIC APPT REQUEST      2. Osteopenia, unspecified location  733.90 M85.80 ONCBCN CLINIC APPT REQUEST      3. Hypothyroidism, unspecified type  244.9 E03.9           Problem   Breast Cancer ([facility])    left breast DCIS in 2009 and had lumpectomy and radiation and started on Tamoxifen for ER + disease.   She had an abnormal finding in the left breast in 2011 and had a mastectomy and was found to have an invasive ductal carcinoma with ER- and HER2+ disease with 1/16 lymph nodes positive.   She received therapy with Taxotere, Carboplatin, and herceptin and completed the herceptin therapy in Dec of 2012.      Osteopenia   Hypothyroid       Breast cancer (CMS-HCC)  73 year old patient with a diagnosis of DCIS at age 55.  She had a left breast lumpectomy and radiation was started on tamoxifen.  Unfortunately she had a second mass developed in 2011.  At that time was ER negative HER2/neu positive she underwent mastectomy with reconstruction followed by chemotherapy.  She had 1 of 16 positive  nodes.  She did 1 year of Herceptin.  Continues to follow-up with right breast mammography due again in May 2025.    At this time she has no evidence of recurrence.  No treatment.  Continues to follow-up on a yearly basis with clinical breast exam. She will contact us  if she has any new or concerning symptoms prior to follow-up visit.    Continues to follow-up with her primary care physician for routine health and screening to include lab work.  Next bone density will be due in October 2025.  Continues to follow-up with her endocrinologist through [facility] and is on Prolia every 6 months.    Patient had bone scan performed in May 2024 with no evidence of lytic lesions.    Medications were reviewed and updated.  Patient verbalized understanding of the plan of care.  She will contact us  if she has any new or concerning symptoms prior to her next visit in 1 year.    Osteopenia  followed by Endocrinology and getting Prolia every 6 months.    Hypothyroid  Remains on Levothyroxine 75mcg daily.         Lab is done with her PCP and the Cardiology team due today.       Parts of this note were created with voice recognition software. Please excuse any grammatical or typographical errors.

## 2023-07-21 ENCOUNTER — Encounter: Admit: 2023-07-21 | Discharge: 2023-07-21 | Payer: MEDICARE
